# Patient Record
Sex: Female | Born: 2000 | Race: Black or African American | Hispanic: No | Marital: Single | Smoking: Never smoker
Health system: Southern US, Community
[De-identification: ages and names within clinical notes are randomized; demographics above are authoritative.]

## PROBLEM LIST (undated history)

## (undated) DIAGNOSIS — L309 Dermatitis, unspecified: Secondary | ICD-10-CM

## (undated) HISTORY — PX: NO PAST SURGERIES: SHX2092

---
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
## 2022-04-28 DIAGNOSIS — Z0000 Encounter for general adult medical examination without abnormal findings: Secondary | ICD-10-CM | POA: Diagnosis not present

## 2022-05-27 DIAGNOSIS — H5213 Myopia, bilateral: Secondary | ICD-10-CM | POA: Diagnosis not present

## 2023-04-05 DIAGNOSIS — Z30013 Encounter for initial prescription of injectable contraceptive: Secondary | ICD-10-CM | POA: Diagnosis not present

## 2023-04-06 DIAGNOSIS — Z3042 Encounter for surveillance of injectable contraceptive: Secondary | ICD-10-CM | POA: Diagnosis not present

## 2023-05-06 DIAGNOSIS — R87612 Low grade squamous intraepithelial lesion on cytologic smear of cervix (LGSIL): Secondary | ICD-10-CM | POA: Diagnosis not present

## 2023-05-06 DIAGNOSIS — Z01419 Encounter for gynecological examination (general) (routine) without abnormal findings: Secondary | ICD-10-CM | POA: Diagnosis not present

## 2023-05-06 DIAGNOSIS — Z13228 Encounter for screening for other metabolic disorders: Secondary | ICD-10-CM | POA: Diagnosis not present

## 2023-05-06 DIAGNOSIS — Z13220 Encounter for screening for lipoid disorders: Secondary | ICD-10-CM | POA: Diagnosis not present

## 2023-06-30 DIAGNOSIS — Z3042 Encounter for surveillance of injectable contraceptive: Secondary | ICD-10-CM | POA: Diagnosis not present

## 2023-06-30 DIAGNOSIS — Z3009 Encounter for other general counseling and advice on contraception: Secondary | ICD-10-CM | POA: Diagnosis not present

## 2023-07-13 DIAGNOSIS — R87610 Atypical squamous cells of undetermined significance on cytologic smear of cervix (ASC-US): Secondary | ICD-10-CM | POA: Diagnosis not present

## 2023-07-13 DIAGNOSIS — R87612 Low grade squamous intraepithelial lesion on cytologic smear of cervix (LGSIL): Secondary | ICD-10-CM | POA: Diagnosis not present

## 2023-09-17 DIAGNOSIS — Z3042 Encounter for surveillance of injectable contraceptive: Secondary | ICD-10-CM | POA: Diagnosis not present

## 2024-03-25 ENCOUNTER — Encounter (HOSPITAL_COMMUNITY): Payer: Self-pay

## 2024-03-25 ENCOUNTER — Ambulatory Visit (HOSPITAL_COMMUNITY)
Admission: RE | Admit: 2024-03-25 | Discharge: 2024-03-25 | Disposition: A | Discharge status: Home / Self Care | Source: Ambulatory Visit | Attending: Family Medicine | Admitting: Family Medicine

## 2024-03-25 VITALS — BP 129/83 | HR 100 | Temp 98.0°F | Resp 16

## 2024-03-25 DIAGNOSIS — R112 Nausea with vomiting, unspecified: Secondary | ICD-10-CM | POA: Diagnosis not present

## 2024-03-25 DIAGNOSIS — K529 Noninfective gastroenteritis and colitis, unspecified: Secondary | ICD-10-CM

## 2024-03-25 DIAGNOSIS — Z3202 Encounter for pregnancy test, result negative: Secondary | ICD-10-CM | POA: Diagnosis not present

## 2024-03-25 LAB — POCT URINALYSIS DIP (MANUAL ENTRY)
Bilirubin, UA: NEGATIVE
Glucose, UA: NEGATIVE mg/dL
Leukocytes, UA: NEGATIVE
Nitrite, UA: NEGATIVE
Spec Grav, UA: 1.02 (ref 1.010–1.025)
Urobilinogen, UA: 0.2 U/dL
pH, UA: 6 (ref 5.0–8.0)

## 2024-03-25 LAB — POCT URINE PREGNANCY: Preg Test, Ur: NEGATIVE

## 2024-03-25 MED ORDER — ONDANSETRON 4 MG PO TBDP: 4.0000 mg | ORAL_TABLET | Freq: Three times a day (TID) | ORAL | 0 refills | Status: AC | PRN

## 2024-03-25 NOTE — Discharge Instructions (Addendum)
 Today we discussed your concerns for diarrhea, nausea, and vomiting. It is difficult to say if this is due to an infectious source but given the onset of your symptoms and how they are resolving I suspect that this is likely due to a viral cause.  Diarrhea is usually self-limited and can have many causes The central tenants of management include the following: Staying well hydrated - this includes increasing water intake and supplementing with Pedialyte, Gatorade, etc. Try to eat a bland diet- this includes boiled chicken, brown rice, bananas, applesauce, plain toast, etc. As you are feeling better you can usually begin to slowly reincorporate your normal dietary choices back into your dietary consumption Using Imodium and Pepto Bismol as needed and according to manufacturers instructions to provide assistance with managing symptoms.   I have sent in a medication called Zofran to help with your nausea and vomiting.  The main side effect of this is constipation so please be aware of this as you are taking it.  It might help a little bit with your diarrhea but if you start to have more significant constipation you can use a stool softener or a dose or 2 of MiraLAX as needed.  Diarrhea with the following will need to be monitored and may require further testing: bloody diarrhea, fever, weight loss, signs of dehydration, weakness, fatigue, incontinence, diarrhea lasting longer than 1-3 weeks

## 2024-03-25 NOTE — ED Triage Notes (Addendum)
 Patient states she began having N/V/D and abdominal pain 4 days ago. No vomiting or abdominal pain today.   Patient states she took Advil x 1 for abdominal cramping 2 days ago.  Patient added that she had a fever 2 days ago only and took Cold and flu medication for that.

## 2024-03-25 NOTE — ED Provider Notes (Signed)
 [facility]    CSN: [id number] Arrival date & time: 03/25/24  1602      History   Chief Complaint Chief Complaint  Patient presents with   Nausea   Diarrhea    HPI Kristen Clarke is a 23 y.o. female.   HPI  She reports she started to have a fever on thurs  Tmax 99 - reports subjective fever symptoms   She states she started to have vomiting and abdominal cramping on Thurs  She states she last vomited yesterday   She also reports watery stools  She states as of today she is having more solid stools but they are still more liquid than her normal   She denies coffee ground emesis, blood in stool She reports some limited incontinence while she was vomiting   She reports she was having liquid stools after every meal  She states she does feel like she is able to tolerate PO intake   She denies similar symptoms in close contacts   She reports LMP ended yesterday    Past Medical History:  Diagnosis Date   Eczema     There are no active problems to display for this patient.   History reviewed. No pertinent surgical history.  OB History   No obstetric history on file.      Home Medications    Prior to Admission medications   Medication Sig Start Date End Date Taking? Authorizing Provider  ondansetron (ZOFRAN-ODT) 4 MG disintegrating tablet Take 1 tablet (4 mg total) by mouth every 8 (eight) hours as needed for nausea or vomiting. 03/25/24  Yes Raylea Adcox, Oswaldo Conroy, PA-C    Family History Family History  Problem Relation Age of Onset   Diabetes Mother    Hypertension Mother    Luiz Blare' disease Mother    Diabetes Father    Hypertension Father     Social History Social History   Tobacco Use   Smoking status: Never   Smokeless tobacco: Never  Vaping Use   Vaping status: Never Used  Substance Use Topics   Alcohol use: Yes    Comment: occasionally   Drug use: Never     Allergies   Patient has no known allergies.   Review of Systems Review  of Systems  Constitutional:  Positive for chills, diaphoresis, fatigue and fever.  Respiratory:  Negative for cough, choking and shortness of breath.   Gastrointestinal:  Positive for abdominal pain, diarrhea, nausea and vomiting. Negative for abdominal distention and blood in stool.  Genitourinary:  Negative for dysuria.     Physical Exam Triage Vital Signs ED Triage Vitals [03/25/24 1625]  Encounter Vitals Group     BP 129/83     Systolic BP Percentile      Diastolic BP Percentile      Pulse Rate 100     Resp 16     Temp 98 F (36.7 C)     Temp Source Oral     SpO2 96 %     Weight      Height      Head Circumference      Peak Flow      Pain Score 0     Pain Loc      Pain Education      Exclude from Growth Chart    No data found.  Updated Vital Signs BP 129/83 (BP Location: Left Arm)   Pulse 100   Temp 98 F (36.7 C) (Oral)   Resp 16  LMP 03/19/2024   SpO2 96%   Visual Acuity Right Eye Distance:   Left Eye Distance:   Bilateral Distance:    Right Eye Near:   Left Eye Near:    Bilateral Near:     Physical Exam Vitals reviewed.  Constitutional:      General: She is awake.     Appearance: Normal appearance. She is well-developed and well-groomed.  HENT:     Head: Normocephalic and atraumatic.  Cardiovascular:     Rate and Rhythm: Normal rate and regular rhythm.     Pulses: Normal pulses.          Radial pulses are 2+ on the right side and 2+ on the left side.     Heart sounds: Normal heart sounds. No murmur heard.    No friction rub. No gallop.  Pulmonary:     Effort: Pulmonary effort is normal.     Breath sounds: Normal breath sounds. No decreased air movement. No decreased breath sounds, wheezing, rhonchi or rales.  Abdominal:     General: Abdomen is flat. Bowel sounds are normal.     Palpations: Abdomen is soft.     Tenderness: There is no abdominal tenderness.  Musculoskeletal:     Cervical back: Normal range of motion.  Skin:    General:  Skin is warm and dry.  Neurological:     General: No focal deficit present.     Mental Status: She is alert and oriented to person, place, and time. Mental status is at baseline.     GCS: GCS eye subscore is 4. GCS verbal subscore is 5. GCS motor subscore is 6.  Psychiatric:        Attention and Perception: Attention and perception normal.        Mood and Affect: Mood and affect normal.        Speech: Speech normal.        Behavior: Behavior normal. Behavior is cooperative.        Thought Content: Thought content normal.        Cognition and Memory: Cognition normal.        Judgment: Judgment normal.      UC Treatments / Results  Labs (all labs ordered are listed, but only abnormal results are displayed) Labs Reviewed  POCT URINALYSIS DIP (MANUAL ENTRY) - Abnormal; Notable for the following components:      Result Value   Color, UA straw (*)    Clarity, UA cloudy (*)    Ketones, POC UA small (15) (*)    Blood, UA trace-intact (*)    Protein Ur, POC trace (*)    All other components within normal limits  POCT URINE PREGNANCY    EKG   Radiology No results found.  Procedures Procedures (including critical care time)  Medications Ordered in UC Medications - No data to display  Initial Impression / Assessment and Plan / UC Course  I have reviewed the triage vital signs and the nursing notes.  Pertinent labs & imaging results that were available during my care of the patient were reviewed by me and considered in my medical decision making (see chart for details).      Final Clinical Impressions(s) / UC Diagnoses   Final diagnoses:  Nausea vomiting and diarrhea  Gastroenteritis   Patient presents today with concerns for abdominal cramping, diarrhea, nausea and vomiting.  She reports that her symptoms started around Wednesday and have ebbed and  flowed.  She reports that she  has not vomited since yesterday.  She denies coffee-ground emesis, blood in stool. She  reports symptoms seem to be improving but she would like to be evaluated and cleared to return to work. Physical exam and vitals are overall reassuring at this time. Recommend symptomatic management for now: increased hydration, bland diet with gradual return to normal diet as tolerated, Pepto-Bismol.  I have sent in Zofran to assist with nausea and vomiting.  Reviewed most common side effects of medication with patient.  ED return precautions were reviewed and provided in after visit summary.  Follow-up as needed for progressing or persistent symptoms     Discharge Instructions      Today we discussed your concerns for diarrhea, nausea, and vomiting. It is difficult to say if this is due to an infectious source but given the onset of your symptoms and how they are resolving I suspect that this is likely due to a viral cause.  Diarrhea is usually self-limited and can have many causes The central tenants of management include the following: Staying well hydrated - this includes increasing water intake and supplementing with Pedialyte, Gatorade, etc. Try to eat a bland diet- this includes boiled chicken, brown rice, bananas, applesauce, plain toast, etc. As you are feeling better you can usually begin to slowly reincorporate your normal dietary choices back into your dietary consumption Using Imodium and Pepto Bismol as needed and according to manufacturers instructions to provide assistance with managing symptoms.   I have sent in a medication called Zofran to help with your nausea and vomiting.  The main side effect of this is constipation so please be aware of this as you are taking it.  It might help a little bit with your diarrhea but if you start to have more significant constipation you can use a stool softener or a dose or 2 of MiraLAX as needed.  Diarrhea with the following will need to be monitored and may require further testing: bloody diarrhea, fever, weight loss, signs of dehydration,  weakness, fatigue, incontinence, diarrhea lasting longer than 1-3 weeks        ED Prescriptions     Medication Sig Dispense Auth. Provider   ondansetron (ZOFRAN-ODT) 4 MG disintegrating tablet Take 1 tablet (4 mg total) by mouth every 8 (eight) hours as needed for nausea or vomiting. 20 tablet Ravenne Wayment E, PA-C      PDMP not reviewed this encounter.   Roselind Messier 03/25/24 1904

## 2024-03-27 DIAGNOSIS — R635 Abnormal weight gain: Secondary | ICD-10-CM | POA: Diagnosis not present

## 2024-03-27 DIAGNOSIS — L209 Atopic dermatitis, unspecified: Secondary | ICD-10-CM | POA: Diagnosis not present

## 2024-03-27 DIAGNOSIS — R002 Palpitations: Secondary | ICD-10-CM | POA: Diagnosis not present

## 2024-03-27 DIAGNOSIS — K529 Noninfective gastroenteritis and colitis, unspecified: Secondary | ICD-10-CM | POA: Diagnosis not present

## 2024-09-05 ENCOUNTER — Ambulatory Visit: Admitting: *Deleted

## 2024-09-05 ENCOUNTER — Other Ambulatory Visit (INDEPENDENT_AMBULATORY_CARE_PROVIDER_SITE_OTHER): Payer: Self-pay

## 2024-09-05 VITALS — BP 128/73 | HR 102 | Wt 185.0 lb

## 2024-09-05 DIAGNOSIS — Z3400 Encounter for supervision of normal first pregnancy, unspecified trimester: Secondary | ICD-10-CM | POA: Diagnosis not present

## 2024-09-05 DIAGNOSIS — Z3401 Encounter for supervision of normal first pregnancy, first trimester: Secondary | ICD-10-CM

## 2024-09-05 DIAGNOSIS — Z3A10 10 weeks gestation of pregnancy: Secondary | ICD-10-CM

## 2024-09-05 DIAGNOSIS — Z3402 Encounter for supervision of normal first pregnancy, second trimester: Secondary | ICD-10-CM | POA: Insufficient documentation

## 2024-09-05 DIAGNOSIS — O3680X0 Pregnancy with inconclusive fetal viability, not applicable or unspecified: Secondary | ICD-10-CM

## 2024-09-05 NOTE — Progress Notes (Signed)
 New OB Intake  I explained I am completing New OB Intake today. We discussed EDD of 04/10/2025, by Last Menstrual Period. Pt is G1P0. I reviewed her allergies, medications and Medical/Surgical/OB history.    Patient Active Problem List   Diagnosis Date Noted   Encounter for supervision of normal first pregnancy in first trimester 09/05/2024    Concerns addressed today  Patient informed that the ultrasound is considered a limited obstetric ultrasound and is not intended to be a complete ultrasound exam.  Patient also informed that the ultrasound is not being completed with the intent of assessing for fetal or placental anomalies or any pelvic abnormalities. Explained that the purpose of today's ultrasound is to assess for viability.  Patient acknowledges the purpose of the exam and the limitations of the study.     Delivery Plans Plans to deliver at [facility]. Discussed the nature of our practice with multiple providers including residents and students. Due to the size of the practice, the delivering provider may not be the same as those providing prenatal care.   MyChart/Babyscripts MyChart access verified. I explained pt will have some visits in office and some virtually. Babyscripts app discussed and ordered.   Blood Pressure Cuff Blood pressure cuff discussed and given.Discussed to be used for virtual visits and or if needed BP checks weekly.  Anatomy US  Explained first scheduled US  will be around 19 weeks.   Last Pap Will reach out to PCP to see if she did one.  First visit review I reviewed new OB appt with patient. Explained pt will be seen by Claris Cedar, CNM at first visit. Discussed [place] genetic screening with patient will decide at [facility]. Routine prenatal labs collected today.    Wanda Buckles, RN 09/05/2024  3:36 PM

## 2024-09-06 LAB — CBC/D/PLT+RPR+RH+ABO+RUBIGG...
Antibody Screen: NEGATIVE
Basophils Absolute: 0 x10E3/uL (ref 0.0–0.2)
Basos: 0 %
EOS (ABSOLUTE): 0.1 x10E3/uL (ref 0.0–0.4)
Eos: 0 %
HCV Ab: NONREACTIVE
HIV Screen 4th Generation wRfx: NONREACTIVE
Hematocrit: 39 % (ref 34.0–46.6)
Hemoglobin: 13 g/dL (ref 11.1–15.9)
Hepatitis B Surface Ag: NEGATIVE
Immature Grans (Abs): 0 x10E3/uL (ref 0.0–0.1)
Immature Granulocytes: 0 %
Lymphocytes Absolute: 1.9 x10E3/uL (ref 0.7–3.1)
Lymphs: 14 %
MCH: 30.5 pg (ref 26.6–33.0)
MCHC: 33.3 g/dL (ref 31.5–35.7)
MCV: 92 fL (ref 79–97)
Monocytes Absolute: 1.1 x10E3/uL — ABNORMAL HIGH (ref 0.1–0.9)
Monocytes: 8 %
Neutrophils Absolute: 10.7 x10E3/uL — ABNORMAL HIGH (ref 1.4–7.0)
Neutrophils: 78 %
Platelets: 259 x10E3/uL (ref 150–450)
RBC: 4.26 x10E6/uL (ref 3.77–5.28)
RDW: 12.5 % (ref 11.7–15.4)
RPR Ser Ql: NONREACTIVE
Rh Factor: POSITIVE
Rubella Antibodies, IGG: 2.07 {index} (ref >0.99)
WBC: 13.8 x10E3/uL — ABNORMAL HIGH (ref 3.4–10.8)

## 2024-09-06 LAB — HCV INTERPRETATION

## 2024-09-07 LAB — URINE CULTURE, OB REFLEX

## 2024-09-07 LAB — CULTURE, OB URINE

## 2024-09-11 ENCOUNTER — Other Ambulatory Visit: Payer: Self-pay

## 2024-09-11 DIAGNOSIS — Z3400 Encounter for supervision of normal first pregnancy, unspecified trimester: Secondary | ICD-10-CM

## 2024-09-11 MED ORDER — PRENATAL PLUS VITAMIN/MINERAL 27-1 MG PO TABS: 1.0000 | ORAL_TABLET | ORAL | 12 refills | Status: AC

## 2024-09-20 ENCOUNTER — Ambulatory Visit: Admitting: Certified Nurse Midwife

## 2024-09-20 VITALS — BP 126/75 | HR 105 | Wt 187.0 lb

## 2024-09-20 DIAGNOSIS — Z3A11 11 weeks gestation of pregnancy: Secondary | ICD-10-CM | POA: Diagnosis not present

## 2024-09-20 DIAGNOSIS — R519 Headache, unspecified: Secondary | ICD-10-CM

## 2024-09-20 DIAGNOSIS — O26891 Other specified pregnancy related conditions, first trimester: Secondary | ICD-10-CM

## 2024-09-20 DIAGNOSIS — Z3491 Encounter for supervision of normal pregnancy, unspecified, first trimester: Secondary | ICD-10-CM

## 2024-09-20 DIAGNOSIS — Z3401 Encounter for supervision of normal first pregnancy, first trimester: Secondary | ICD-10-CM

## 2024-09-20 NOTE — Progress Notes (Signed)
 INITIAL PRENATAL VISIT  History:  Kristen Clarke is a 23 y.o. G1P0 at [redacted]w[redacted]d by LMP being seen today for her first obstetrical visit.  Her obstetrical history is significant for n/a . Patient does intend to breast feed. Pregnancy history fully reviewed.  Patient reports spells of hot flashes followed by lightheadedness and dizziness. She also reports some personal life stressors but has coping mechanisms in place at this time.  SABRA  HISTORY: OB History  Gravida Para Term Preterm AB Living  1 0 0 0 0 0  SAB IAB Ectopic Multiple Live Births  0 0 0 0 0    # Outcome Date GA Lbr Len/2nd Weight Sex Type Anes PTL Lv  1 Current             Last pap smear was done 1 month ago at her PCP and normal per patient report and she doe not want to repeat testing today. She desires to go to her PCP and request records    Past Medical History:  Diagnosis Date   Eczema    Past Surgical History:  Procedure Laterality Date   NO PAST SURGERIES     Family History  Problem Relation Age of Onset   Diabetes Mother    Hypertension Mother    Yvone' disease Mother    Diabetes Father    Hypertension Father    Social History   Tobacco Use   Smoking status: Never   Smokeless tobacco: Never  Vaping Use   Vaping status: Never Used  Substance Use Topics   Alcohol use: Not Currently    Comment: occasionally   Drug use: Not Currently   No Known Allergies Current Outpatient Medications on File Prior to Visit  Medication Sig Dispense Refill   Prenatal Vit-Fe Fumarate-FA (PRENATAL PLUS VITAMIN/MINERAL) 27-1 MG TABS Take 1 tablet by mouth as directed. 30 tablet 12   ondansetron  (ZOFRAN -ODT) 4 MG disintegrating tablet Take 1 tablet (4 mg total) by mouth every 8 (eight) hours as needed for nausea or vomiting. (Patient not taking: Reported on 09/20/2024) 20 tablet 0   No current facility-administered medications on file prior to visit.    Review of Systems Pertinent items noted in HPI and remainder of  comprehensive ROS otherwise negative.  Indications for ASA therapy (per UpToDate) One of the following: (Needs 162 mg daily) Previous pregnancy with preeclampsia, especially early onset and with an adverse outcome No Chronic hypertension No Type 1 or 2 diabetes mellitus No Multifetal gestation No Chronic kidney disease No Autoimmune disease (antiphospholipid syndrome, systemic lupus erythematosus) No Two or more of the following: (Can do 81 mg daily) Nulliparity Yes Obesity (body mass index >30 kg/m2) No Family history of preeclampsia in mother or sister No Age >=35 years No Sociodemographic characteristics (African American race, low socioeconomic level) Yes Personal risk factors (eg, previous pregnancy with low birth weight or small for gestational age infant, previous adverse pregnancy outcome [eg, stillbirth], interval >10 years between pregnancies) No In vitro conception No  Physical Exam:   Vitals:   09/20/24 1008  BP: 126/75  Pulse: (!) 105  Weight: 187 lb (84.8 kg)   Fetal Heart Rate (bpm): 168     General: well-developed, well-nourished female in no acute distress  Skin: normal coloration and turgor, no rashes  Neurologic: oriented, normal, negative, normal mood  Extremities: normal strength, tone, and muscle mass, ROM of all joints is normal  HEENT PERRLA, extraocular movement intact and sclera clear, anicteric  Neck supple and  no masses  Cardiovascular: regular rate and rhythm  Respiratory:  no respiratory distress, normal breath sounds  Abdomen: soft, non-tender; bowel sounds normal; no masses,  no organomegaly  Pelvic: Exam deferred d/t patient request    Assessment:  Pregnancy: G1P0 Patient Active Problem List   Diagnosis Date Noted   Encounter for supervision of normal first pregnancy in first trimester 09/05/2024    Plan:  1. Encounter for supervision of low-risk pregnancy in first trimester (Primary) - Patient overall doing well.   2. [redacted] weeks  gestation of pregnancy - Reviewed 2nd trimester expectations - A1c collected today along side genetic testing  - Patient request genetic testing. To be completed today.   3. Pregnancy headache in first trimester - Patient had headaches prior to pregnancy and reports that they were easily treated with Ibuprofen,  - Reviewed OTC options that are safe in pregnancy.  - Encouraged high protein snacks with carbs for sustained energy.  - Also recommended that patient increase water intake.   Initial labs drawn. Continue prenatal vitamins. Problem list reviewed and updated. Genetic Screening discussed, Panorama and Horizon: requested. Ultrasound discussed; fetal anatomic survey: planned. Anticipatory guidance about prenatal visits given including labs, ultrasounds, and testing. Weight gain recommendations per IOM guidelines reviewed: underweight/BMI 18.5 or less > 28 - 40 lbs; normal weight/BMI 18.5 - 24.9 > 25 - 35 lbs; overweight/BMI 25 - 29.9 > 15 - 25 lbs; obese/BMI 30 or more > 11 - 20 lbs. Discussed usage of the Babyscripts app for more information about pregnancy, and to track blood pressures. Also discussed usage of virtual visits as additional source of managing and completing prenatal visits.  Patient was encouraged to use MyChart to review results, send requests, and have questions addressed.   The nature of [facility] with multiple MDs and Advanced Practice Providers was explained to patient; also emphasized that residents, students are part of our team. Routine obstetric precautions reviewed. Encouraged to seek out care at our office or emergency room [facility] preferred) for urgent and/or emergent concerns. Return in about 4 weeks (around 10/18/2024) for LOB.   [name]) Emilio, MSN, CNM  [facility]  09/20/2024 1:35 PM

## 2024-10-06 ENCOUNTER — Encounter: Payer: Self-pay | Admitting: Certified Nurse Midwife

## 2024-11-15 ENCOUNTER — Ambulatory Visit (HOSPITAL_BASED_OUTPATIENT_CLINIC_OR_DEPARTMENT_OTHER)

## 2024-11-15 ENCOUNTER — Ambulatory Visit: Attending: Obstetrics and Gynecology | Admitting: Maternal & Fetal Medicine

## 2024-11-15 VITALS — BP 126/69 | HR 77

## 2024-11-15 DIAGNOSIS — O321XX0 Maternal care for breech presentation, not applicable or unspecified: Secondary | ICD-10-CM | POA: Diagnosis not present

## 2024-11-15 DIAGNOSIS — Z3689 Encounter for other specified antenatal screening: Secondary | ICD-10-CM | POA: Diagnosis not present

## 2024-11-15 DIAGNOSIS — Z3401 Encounter for supervision of normal first pregnancy, first trimester: Secondary | ICD-10-CM

## 2024-11-15 DIAGNOSIS — Z3A19 19 weeks gestation of pregnancy: Secondary | ICD-10-CM | POA: Diagnosis not present

## 2024-11-15 DIAGNOSIS — Z3402 Encounter for supervision of normal first pregnancy, second trimester: Secondary | ICD-10-CM | POA: Diagnosis not present

## 2024-11-15 DIAGNOSIS — Z3400 Encounter for supervision of normal first pregnancy, unspecified trimester: Secondary | ICD-10-CM | POA: Diagnosis not present

## 2024-11-15 NOTE — Progress Notes (Addendum)
   Patient information  Patient Name: Kristen Clarke  Patient MRN:   [id number]  Referring practice: MFM Referring Provider: [facility]  Problem List   Patient Active Problem List   Diagnosis Date Noted   Encounter for supervision of normal first pregnancy in first trimester 09/05/2024    Maternal Fetal medicine Consult  Kristen Clarke is a 23 y.o. G1P0 at [redacted]w[redacted]d here for ultrasound and consultation. Kimia Kobayashi is doing well today with no acute concerns. Today we focused on the following:   The patient was here for her growth ultrasound.  The estimated fetal weight is not within normal limits.  The patient is here to have an ultrasound before I have an abortion. She was already aware of the [place] laws regarding abortion and is [facility] but she needed an ultrasound to confirm dating per her report. I discussed her option such as adoption, or parenting and she expressed social and relational anxieties that are leading her to choose abortion. She is worried about being able to provide for this child financially and does not have a loving committing relationship. She states her mother is supportive of abortion as well. She does not feel coerced in this decision. I affirmed that this is within her legal right in the sate in which she is seeking care and also expressed the desire to provide pregnancy care for her if she changes her mind and decides to proceed with pregnancy.   I discussed the ultrasound shows a normal appearing fetus measuring [redacted]w[redacted]d, but she should keep her previous dating making her [redacted]w[redacted]d.   The patient had time to ask questions that were answered to her satisfaction.  She verbalized understanding and agrees to proceed with the plan below.  Recommendations - A f/u ultrasound was offered but the patient declined.  - [facility]    I spent 30 minutes  reviewing the patients chart, including labs and images as well as counseling the patient about her medical conditions. Greater than 50% of the time was spent in direct face-to-face patient counseling.  Delora Smaller  MFM, [place]   11/15/2024  10:54 AM   Review of Systems: A review of systems was performed and was negative except per HPI   Vitals and Physical Exam    11/15/2024   10:35 AM 09/20/2024   10:08 AM 09/05/2024    2:58 PM  Vitals with BMI  Weight  187 lbs 185 lbs  Systolic 126 126 871  Diastolic 69 75 73  Pulse 77 105 102    Sitting comfortably on the sonogram table Nonlabored breathing Normal rate and rhythm Abdomen is nontender  Past pregnancies OB History  Gravida Para Term Preterm AB Living  1       SAB IAB Ectopic Multiple Live Births          # Outcome Date GA Lbr Len/2nd Weight Sex Type Anes PTL Lv  1 Current              No future appointments.
